# Patient Record
Sex: Female | Born: 1950 | Race: Black or African American | Hispanic: No | Marital: Married
Health system: Southern US, Community
[De-identification: ages and names within clinical notes are randomized; demographics above are authoritative.]

## PROBLEM LIST (undated history)

## (undated) HISTORY — PX: ABDOMINAL SURGERY: SHX537

---

## 2019-09-12 ENCOUNTER — Emergency Department (HOSPITAL_COMMUNITY)
Admission: EM | Admit: 2019-09-12 | Discharge: 2019-09-13 | Disposition: A | Discharge status: Home / Self Care | Payer: Medicare Other | Attending: Emergency Medicine | Admitting: Emergency Medicine

## 2019-09-12 ENCOUNTER — Other Ambulatory Visit: Payer: Self-pay

## 2019-09-12 ENCOUNTER — Emergency Department (HOSPITAL_COMMUNITY): Payer: Medicare Other

## 2019-09-12 ENCOUNTER — Encounter (HOSPITAL_COMMUNITY): Payer: Self-pay

## 2019-09-12 DIAGNOSIS — S8002XA Contusion of left knee, initial encounter: Secondary | ICD-10-CM | POA: Insufficient documentation

## 2019-09-12 DIAGNOSIS — Y9389 Activity, other specified: Secondary | ICD-10-CM | POA: Diagnosis not present

## 2019-09-12 DIAGNOSIS — Y999 Unspecified external cause status: Secondary | ICD-10-CM | POA: Insufficient documentation

## 2019-09-12 DIAGNOSIS — W010XXA Fall on same level from slipping, tripping and stumbling without subsequent striking against object, initial encounter: Secondary | ICD-10-CM | POA: Diagnosis not present

## 2019-09-12 DIAGNOSIS — Y92009 Unspecified place in unspecified non-institutional (private) residence as the place of occurrence of the external cause: Secondary | ICD-10-CM | POA: Diagnosis not present

## 2019-09-12 DIAGNOSIS — W19XXXA Unspecified fall, initial encounter: Secondary | ICD-10-CM

## 2019-09-12 DIAGNOSIS — S8992XA Unspecified injury of left lower leg, initial encounter: Secondary | ICD-10-CM | POA: Diagnosis present

## 2019-09-12 MED ORDER — ACETAMINOPHEN 325 MG PO TABS
650.0000 mg | ORAL_TABLET | Freq: Once | ORAL | Status: AC
  Administered 2019-09-12: 650 mg via ORAL
  Filled 2019-09-12: qty 2

## 2019-09-12 NOTE — ED Provider Notes (Signed)
[facility]-EMERGENCY DEPT Provider Note   CSN: [id number] Arrival date & time: 09/12/19  2215     History Chief Complaint  Patient presents with  . Knee Pain    Angel Morrison is a 69 y.o. female.  Patient to ED for evaluation of left knee pain after she tripped and fell in her living room onto carpeted surface. No other injury. She did not hit her head. No abdominal or chest pain. No wound or bleeding. She is not on blood thinners. She does not walk with a cane or walker regularly.  The history is provided by the patient. No language interpreter was used.  Knee Pain      History reviewed. No pertinent past medical history.  There are no problems to display for this patient.  OB History   No obstetric history on file.     History reviewed. No pertinent family history.  Social History   Tobacco Use  . Smoking status: Not on file  Substance Use Topics  . Alcohol use: Not on file  . Drug use: Not on file    Home Medications Prior to Admission medications   Not on File    Allergies    Patient has no known allergies.  Review of Systems   Review of Systems  Constitutional: Negative for diaphoresis.  Cardiovascular: Negative.  Negative for chest pain.  Gastrointestinal: Negative.  Negative for abdominal pain.  Musculoskeletal:       See HPI.  Skin: Positive for color change. Negative for wound.  Neurological: Negative.     Physical Exam Updated Vital Signs BP (!) 157/97 (BP Location: Left Arm)   Pulse 74   Temp (!) 97.5 F (36.4 C) (Oral)   Resp 16   SpO2 100%   Physical Exam Vitals and nursing note reviewed.  Constitutional:      General: She is not in acute distress.    Appearance: She is well-developed. She is obese.  Cardiovascular:     Pulses: Normal pulses.  Pulmonary:     Effort: Pulmonary effort is normal.  Chest:     Chest wall: No tenderness.  Abdominal:     Tenderness: There is no abdominal tenderness.    Musculoskeletal:     Cervical back: Normal range of motion.     Comments: Left knee has mild swelling to inferior anteromedial surface with associated ecchymosis. No wound. Mildly tender. No bony deformity. Joint stable. No calf or thigh tenderness. FROM all joints of the left LE.   Skin:    General: Skin is warm and dry.  Neurological:     Mental Status: She is alert and oriented to person, place, and time.     ED Results / Procedures / Treatments   Labs (all labs ordered are listed, but only abnormal results are displayed) Labs Reviewed - No data to display  EKG None  Radiology DG Knee Complete 4 Views Left  Result Date: 09/12/2019 CLINICAL DATA:  Tripped and fell, left knee pain, bruising EXAM: LEFT KNEE - COMPLETE 4+ VIEW COMPARISON:  None. FINDINGS: Frontal, bilateral oblique, lateral views of the left knee are obtained. 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 Prominent enthesopathic changes of the patella are also noted. No fracture, subluxation, or dislocation. Mild infrapatellar soft tissue edema. IMPRESSION: 1. Infrapatellar soft tissue edema. 2. No acute fracture. 3. Mild osteoarthritis. Electronically Signed   By: Sharlet Salina M.D.   On: 09/12/2019 23:06    Procedures  Procedures (including critical care time)  Medications Ordered in ED Medications - No data to display  ED Course  I have reviewed the triage vital signs and the nursing notes.  Pertinent labs & imaging results that were available during my care of the patient were reviewed by me and considered in my medical decision making (see chart for details).    MDM Rules/Calculators/A&P                          Patient to ED for evaluation of left knee injury after mechanical fall.   Imaging is negative for fracture. She does not take blood thinners. There is mild bruising as could be expected but no evidence of joint injury. She ambulates and is fully weight  bearing on the left leg.   Recommend tylenol and/or ibuprofen, cool compresses, elevation. PCP follow up as needed.  Final Clinical Impression(s) / ED Diagnoses Final diagnoses:  None   1. Mechanical fall 2. Left knee contusion  Rx / DC Orders ED Discharge Orders    None       Elpidio Anis, PA-C 09/12/19 2343    Shon Baton, MD 09/13/19 [phone]

## 2019-09-12 NOTE — ED Triage Notes (Signed)
Pt reports that she tripped in her den and landed on her knee on carpet. No head injury or LOC. Ambulatory.

## 2019-09-12 NOTE — Discharge Instructions (Addendum)
Your x-rays are negative for any broken bones of the knee. Recommend cool compresses, rest the knee and elevate to reduce swelling. Follow up with your doctor if pain worsens or you have any new or worsening symptoms.

## 2021-05-18 ENCOUNTER — Encounter (HOSPITAL_COMMUNITY): Payer: Self-pay

## 2021-05-18 ENCOUNTER — Emergency Department (HOSPITAL_COMMUNITY): Payer: Medicare Other

## 2021-05-18 ENCOUNTER — Emergency Department (HOSPITAL_COMMUNITY)
Admission: EM | Admit: 2021-05-18 | Discharge: 2021-05-18 | Disposition: A | Discharge status: Home / Self Care | Payer: Medicare Other | Attending: Emergency Medicine | Admitting: Emergency Medicine

## 2021-05-18 DIAGNOSIS — R1013 Epigastric pain: Secondary | ICD-10-CM | POA: Diagnosis present

## 2021-05-18 DIAGNOSIS — R197 Diarrhea, unspecified: Secondary | ICD-10-CM | POA: Diagnosis not present

## 2021-05-18 LAB — COMPREHENSIVE METABOLIC PANEL
ALT: 19 U/L (ref 0–44)
AST: 23 U/L (ref 15–41)
Albumin: 4.4 g/dL (ref 3.5–5.0)
Alkaline Phosphatase: 50 U/L (ref 38–126)
Anion gap: 10 (ref 5–15)
BUN: 23 mg/dL (ref 8–23)
CO2: 24 mmol/L (ref 22–32)
Calcium: 9.6 mg/dL (ref 8.9–10.3)
Chloride: 103 mmol/L (ref 98–111)
Creatinine, Ser: 0.82 mg/dL (ref 0.44–1.00)
GFR, Estimated: >60 mL/min (ref >60)
Glucose, Bld: 139 mg/dL — ABNORMAL HIGH (ref 70–99)
Potassium: 3.8 mmol/L (ref 3.5–5.1)
Sodium: 137 mmol/L (ref 135–145)
Total Bilirubin: 0.6 mg/dL (ref 0.3–1.2)
Total Protein: 8.1 g/dL (ref 6.5–8.1)

## 2021-05-18 LAB — CBC
HCT: 40.9 % (ref 36.0–46.0)
Hemoglobin: 13.1 g/dL (ref 12.0–15.0)
MCH: 29.2 pg (ref 26.0–34.0)
MCHC: 32 g/dL (ref 30.0–36.0)
MCV: 91.1 fL (ref 80.0–100.0)
Platelets: 303 10*3/uL (ref 150–400)
RBC: 4.49 MIL/uL (ref 3.87–5.11)
RDW: 14.4 % (ref 11.5–15.5)
WBC: 8.5 10*3/uL (ref 4.0–10.5)
nRBC: 0 % (ref 0.0–0.2)

## 2021-05-18 LAB — LIPASE, BLOOD: Lipase: 36 U/L (ref 11–51)

## 2021-05-18 MED ORDER — FENTANYL CITRATE PF 50 MCG/ML IJ SOSY
50.0000 ug | PREFILLED_SYRINGE | Freq: Once | INTRAMUSCULAR | Status: AC
  Administered 2021-05-18: 50 ug via INTRAVENOUS
  Filled 2021-05-18: qty 1

## 2021-05-18 MED ORDER — ONDANSETRON 8 MG PO TBDP: 8.0000 mg | ORAL_TABLET | Freq: Three times a day (TID) | ORAL | 0 refills | Status: AC | PRN

## 2021-05-18 MED ORDER — DICYCLOMINE HCL 20 MG PO TABS: 10.0000 mg | ORAL_TABLET | Freq: Two times a day (BID) | ORAL | 0 refills | Status: AC | PRN

## 2021-05-18 MED ORDER — ONDANSETRON HCL 4 MG/2ML IJ SOLN
4.0000 mg | Freq: Once | INTRAMUSCULAR | Status: AC
  Administered 2021-05-18: 4 mg via INTRAVENOUS
  Filled 2021-05-18: qty 2

## 2021-05-18 MED ORDER — IOHEXOL 350 MG/ML SOLN
80.0000 mL | Freq: Once | INTRAVENOUS | Status: AC | PRN
  Administered 2021-05-18: 80 mL via INTRAVENOUS

## 2021-05-18 MED ORDER — IOHEXOL 300 MG/ML  SOLN: 100.0000 mL | Freq: Once | INTRAMUSCULAR | Status: DC | PRN

## 2021-05-18 MED ORDER — SODIUM CHLORIDE (PF) 0.9 % IJ SOLN
INTRAMUSCULAR | Status: AC
Start: 2021-05-18 — End: 2021-05-18
  Filled 2021-05-18: qty 50

## 2021-05-18 MED ORDER — LACTATED RINGERS IV BOLUS
1000.0000 mL | Freq: Once | INTRAVENOUS | Status: AC
  Administered 2021-05-18: 1000 mL via INTRAVENOUS

## 2021-05-18 NOTE — Discharge Instructions (Addendum)
If diarrhea does not resolve in the next 5 days please reports your PCP to provide a stool sample ? ?SEEK IMMEDIATE MEDICAL ATTENTION IF: ?The pain does not go away or becomes severe, particularly over the next 8-12 hours.  ?A temperature above 100.20F develops.  ?Repeated vomiting occurs (multiple episodes).  ?The pain becomes localized to portions of the abdomen.  In an adult, the left lower portion of the abdomen could be colitis or diverticulitis.  ?Blood is being passed in stools or vomit (bright red or black tarry stools).  ?Return also if you develop chest pain, difficulty breathing, dizziness or fainting, or become confused, poorly responsive, or inconsolable. ? ?

## 2021-05-18 NOTE — ED Provider Notes (Signed)
?  Provider Note ?MRN:  [id number]  ?Arrival date & time: 05/18/21    ?ED Course and Medical Decision Making  ?Assumed care from [facility] at shift change. ? ?See not from prior team for complete details, in brief:  ? ?71 year old [place] from out of town ?Sudden onset lower abdominal pain, cramping, diarrhea late last night ? ?Abdominal pain, diarrhea ? ?Clinical picture concerning for enteritis, food borne illness ? ?CT pending ? ? ?Plan per prior physician CT, p.o. challenge ? ?CT has resulted, images reviewed by myself, agree with radiology interpretation.  No acute intra-abdominal findings. ? ?Patient reports she is feeling much better.  Nausea has since resolved.  She is able to tolerate p.o.  Agreeable plan for discharge home.  Close outpatient follow-up ? ?The patient's overall condition has improved, the patient presents with abdominal pain without signs of peritonitis, or other life-threatening serious etiology. The patient understands that at this time there is no evidence for a more malignant underlying process, but the patient also understands that early in the process of an illness, an emergency department workup can be falsely reassuring. Detailed discussions were had with the patient regarding current findings, and need for close f/u with PCP or on call doctor. The patient appears stable for discharge and has been instructed to return immediately if the symptoms worsen in any way for re-evaluation. Patient verbalized understanding and is in agreement with current care plan.  All questions answered prior to discharge. ? ? ?Procedures ? ?Final Clinical Impressions(s) / ED Diagnoses  ? ?  ICD-10-CM   ?1. Epigastric pain  R10.13   ?  ?2. Diarrhea, unspecified type  R19.7   ?  ?  ?ED Discharge Orders   ? ?      Ordered  ?  ondansetron (ZOFRAN-ODT) 8 MG disintegrating tablet  Every 8 hours PRN       ? 05/18/21 0645  ?  dicyclomine (BENTYL) 20 MG tablet  2 times daily PRN       ? 05/18/21 7829  ? ?  ?  ? ?  ?   ? ? ?Discharge Instructions   ? ?  ? If diarrhea does not resolve in the next 5 days please reports your PCP to provide a stool sample ? ?SEEK IMMEDIATE MEDICAL ATTENTION IF: ?The pain does not go away or becomes severe, particularly over the next 8-12 hours.  ?A temperature above 100.27F develops.  ?Repeated vomiting occurs (multiple episodes).  ?The pain becomes localized to portions of the abdomen.  In an adult, the left lower portion of the abdomen could be colitis or diverticulitis.  ?Blood is being passed in stools or vomit (bright red or black tarry stools).  ?Return also if you develop chest pain, difficulty breathing, dizziness or fainting, or become confused, poorly responsive, or inconsolable. ? ? ? ? ? ?  ?Sloan Leiter, DO ?05/18/21 [phone] ? ?

## 2021-05-18 NOTE — ED Notes (Signed)
Full rainbow collected on this pt and sent to lab ?

## 2021-05-18 NOTE — ED Notes (Signed)
Pt placed on bedside commode.  

## 2021-05-18 NOTE — ED Triage Notes (Signed)
Pt came in with c/o abdominal pain and diarrhea that started approx four hours ago. She states it started on a car ride/ Pt states the pain is epigastric. Pt has hx of cholecystectomy. Pt endorses nausea and dry heaving ?

## 2021-05-18 NOTE — ED Provider Notes (Signed)
?[facility]-EMERGENCY DEPT ?Provider Note ? ? ?CSN: [id number] ?Arrival date & time: 05/18/21  7353 ? ?  ? ?History ? ?Chief Complaint  ?Patient presents with  ? Abdominal Pain  ? ? ?Angel Morrison is a 71 y.o. female. ? ?The history is provided by the patient and a relative.  ?Abdominal Pain ?Pain location:  Epigastric ?Pain quality: cramping   ?Pain severity:  Moderate ?Onset quality:  Gradual ?Timing:  Intermittent ?Progression:  Worsening ?Chronicity:  New ?Relieved by:  Nothing ?Worsened by:  Movement and palpation ?Associated symptoms: no fever   ?Patient reports she began having epigastric abdominal pain several hours ago.  She also reports associated nausea, vomiting and diarrhea.  It has been nonbloody.  She reports both episodes of diarrhea.  No known sick contacts.  She does not think it was food related ?She reports multiple abdominal surgeries previously ?  ?  ? ?Home Medications ?Prior to Admission medications   ?Medication Sig Start Date End Date Taking? Authorizing Provider  ?ondansetron (ZOFRAN-ODT) 8 MG disintegrating tablet Take 1 tablet (8 mg total) by mouth every 8 (eight) hours as needed. 05/18/21  Yes Zadie Rhine, MD  ?   ? ?Allergies    ?Patient has no known allergies.   ? ?Review of Systems   ?Review of Systems  ?Constitutional:  Negative for fever.  ?Gastrointestinal:  Positive for abdominal pain.  ? ?Physical Exam ?Updated Vital Signs ?BP (!) 166/84   Pulse 86   Temp 97.9 ?F (36.6 ?C) (Oral)   Resp 18   Ht 1.473 m (4\' 10" )   Wt 87.1 kg   SpO2 96%   BMI 40.13 kg/m?  ?Physical Exam ?CONSTITUTIONAL: Elderly, uncomfortable appearing ?HEAD: Normocephalic/atraumatic ?EYES: EOMI/PERRL, no icterus ?NECK: supple no meningeal signs ?SPINE/BACK:entire spine nontender ?CV: S1/S2 noted, no murmurs/rubs/gallops noted ?LUNGS: Lungs are clear to auscultation bilaterally, no apparent distress ?ABDOMEN: soft, moderate epigastric tenderness, no rebound or guarding, bowel sounds  noted throughout abdomen ?GU:no cva tenderness ?NEURO: Pt is awake/alert/appropriate, moves all extremitiesx4.  No facial droop.   ?EXTREMITIES: pulses normal/equal, full ROM ?SKIN: warm, color normal ?PSYCH: Anxious ? ?ED Results / Procedures / Treatments   ?Labs ?(all labs ordered are listed, but only abnormal results are displayed) ?Labs Reviewed  ?COMPREHENSIVE METABOLIC PANEL - Abnormal; Notable for the following components:  ?    Result Value  ? Glucose, Bld 139 (*)   ? All other components within normal limits  ?LIPASE, BLOOD  ?CBC  ? ? ?EKG ?EKG Interpretation ? ?Date/Time:  Monday May 18 2021 06:10:05 EDT ?Ventricular Rate:  84 ?PR Interval:  162 ?QRS Duration: 100 ?QT Interval:  379 ?QTC Calculation: 448 ?R Axis:   -26 ?Text Interpretation: Sinus rhythm Borderline left axis deviation RSR' in V1 or V2, right VCD or RVH Consider anterior infarct T wave inversion Confirmed by 01-24-1989 (Zadie Rhine) on 05/18/2021 6:19:21 AM ? ?Radiology ?No results found. ? ?Procedures ?Procedures  ? ? ?Medications Ordered in ED ?Medications  ?fentaNYL (SUBLIMAZE) injection 50 mcg (has no administration in time range)  ?ondansetron [facility]) injection 4 mg (4 mg Intravenous Given 05/18/21 0538)  ?fentaNYL (SUBLIMAZE) injection 50 mcg (50 mcg Intravenous Given 05/18/21 0536)  ?lactated ringers bolus 1,000 mL (1,000 mLs Intravenous New Bag/Given 05/18/21 0538)  ? ? ?ED Course/ Medical Decision Making/ A&P ?Clinical Course as of 05/18/21 0715  ?Mon May 18, 2021  ?0604 Glucose(!): 139 ?Mild hyperglycemia [DW]  ?0605 Discussed lab findings with patient.  She is feeling improved.  She has no focal tenderness at this time.  At this point no indication for imaging.  Suspect this will be self-limited and resolved the next 24 hours.  Discussed appropriate use of Imodium and will also prescribe Zofran [DW]  ?[phone] Discussed strict ER return precautions with patient and her daughter [DW]  ?[phone] Patient now reporting she is having abdominal  pain, will proceed with CT imaging [DW]  ?0708 Signed out to Dr Gwenette Greet at shift change to f/u on CT imaging [DW]  ?  ?Clinical Course User Index ?[DW] Zadie Rhine, MD  ? ?                        ?Medical Decision Making ?Amount and/or Complexity of Data Reviewed ?Labs: ordered. Decision-making details documented in ED Course. ?Radiology: ordered. ?ECG/medicine tests: ordered. ? ?Risk ?Prescription drug management. ? ? ?This patient presents to the ED for concern of abdominal pain, vomiting diarrhea, this involves an extensive number of treatment options, and is a complaint that carries with it a high risk of complications and morbidity.  The differential diagnosis includes but is not limited to gastritis, gastroenteritis, bowel obstruction, bowel perforation ? ?Comorbidities that complicate the patient evaluation: ?Patient?s presentation is complicated by their history of obesity ? ?Additional history obtained: ?Additional history obtained from family ?Records reviewed Care Everywhere/External Records ? ?Lab Tests: ?I Ordered, and personally interpreted labs.  The pertinent results include:  hyperglycemia ? ? ?Cardiac Monitoring: ?The patient was maintained on a cardiac monitor.  I personally viewed and interpreted the cardiac monitor which showed an underlying rhythm of:  sinus rhythm ? ?Medicines ordered and prescription drug management: ?I ordered medication including fentanyl and Zofran for pain and nausea ?Reevaluation of the patient after these medicines showed that the patient    improved ? ? ?Reevaluation: ?After the interventions noted above, I reevaluated the patient and found that they have :improved ? ?Complexity of problems addressed: ?Patient?s presentation is most consistent with  acute presentation with potential threat to life or bodily function ? ?  ? ? ? ? ? ? ? ? ?Final Clinical Impression(s) / ED Diagnoses ?Final diagnoses:  ?Epigastric pain  ?Diarrhea, unspecified type  ? ? ?Rx / DC  Orders ?ED Discharge Orders   ? ?      Ordered  ?  ondansetron (ZOFRAN-ODT) 8 MG disintegrating tablet  Every 8 hours PRN       ? 05/18/21 0645  ? ?  ?  ? ?  ? ? ?  ?Zadie Rhine, MD ?05/18/21 0715 ? ?

## 2023-12-02 IMAGING — CT CT ABD-PELV W/ CM
2 of 5 series · 16 of 46 positions shown, 18 images · IV contrast (OMNIPAQUE 350)
Comparison: None.

CLINICAL DATA: Epigastric pain with diarrhea Previous - Abdominal
hernia repairs and SBO^80mL OMNIPAQUE IOHEXOL 350 MG/ML
SOLNAbdominal pain, acute, nonlocalized

EXAM:
CT ABDOMEN AND PELVIS WITH CONTRAST
TECHNIQUE: Multidetector CT imaging of the abdomen and pelvis was performed
using the standard protocol following bolus administration of
intravenous contrast.

[Series 3: axial st · axial · 0.84mm/px · z∈[+1229,+1569]mm · 13 of 80 slices shown, 15 images]
[im 6/80  soft-tissue]
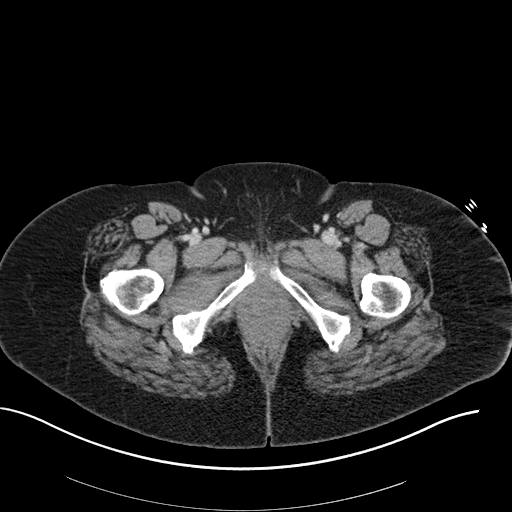
[im 6/80  bone]
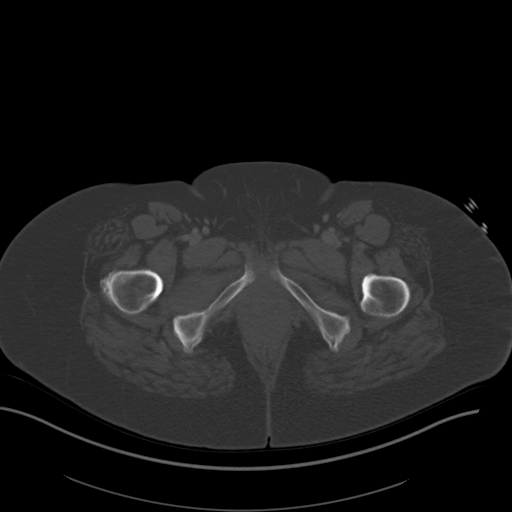
[im 12/80  soft-tissue]
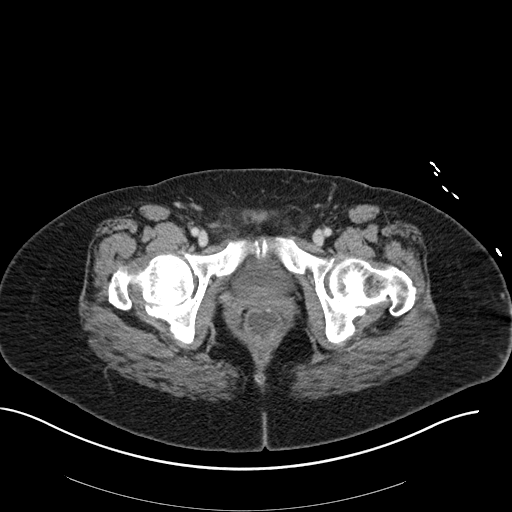
[im 17/80  soft-tissue]
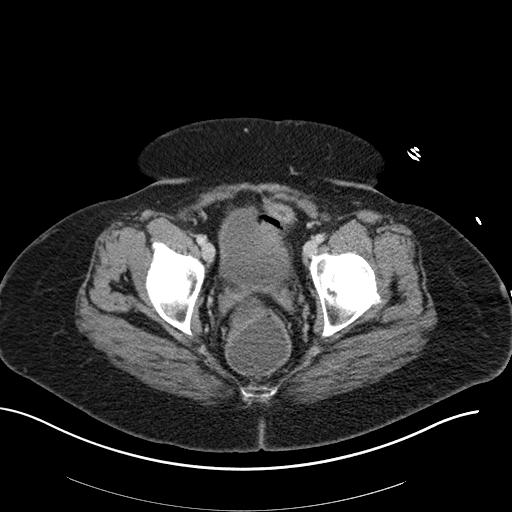
[im 23/80  soft-tissue]
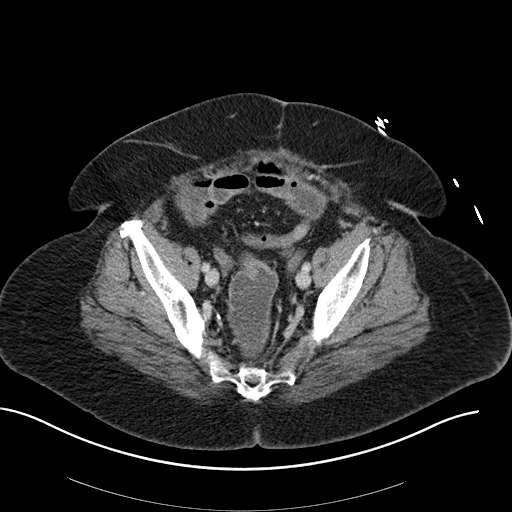
[im 29/80  soft-tissue]
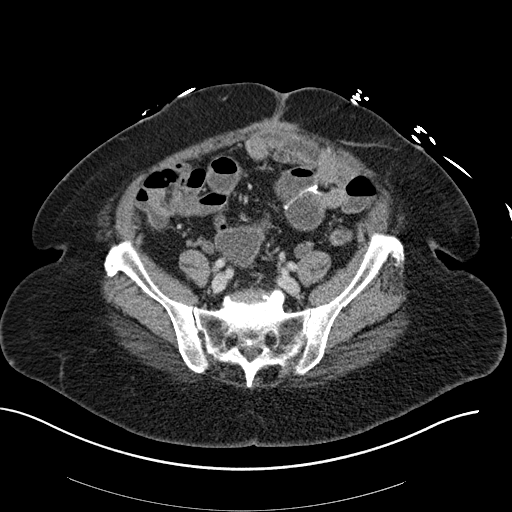
[im 34/80  soft-tissue]
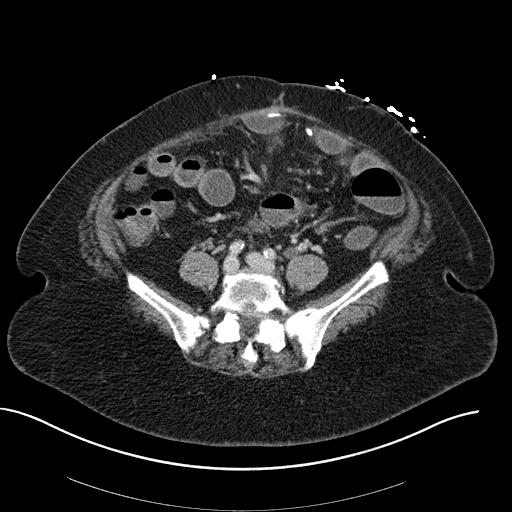
[im 40/80  soft-tissue]
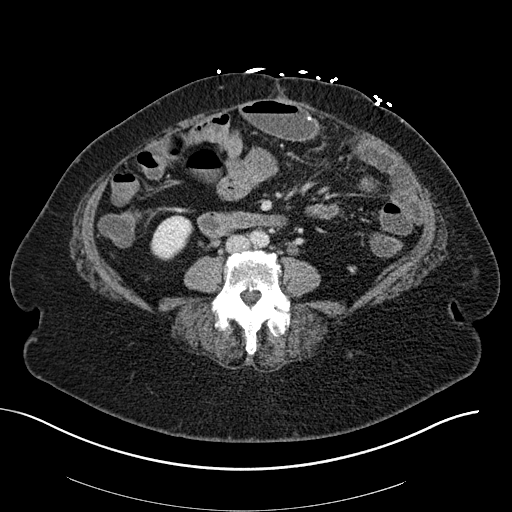
[im 46/80  soft-tissue]
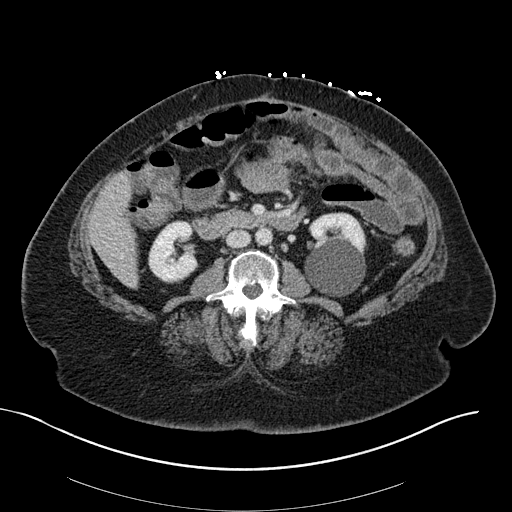
[im 51/80  soft-tissue]
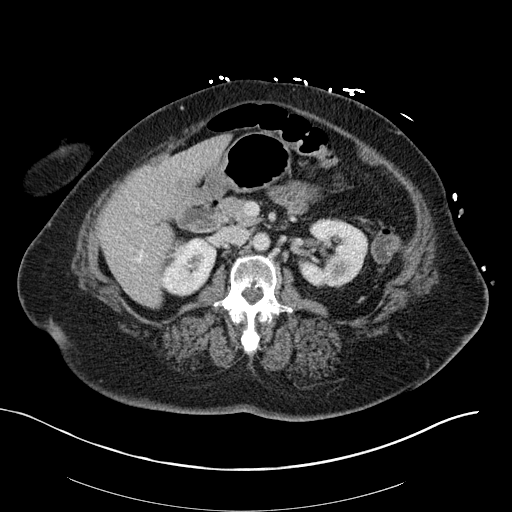
[im 51/80  bone]
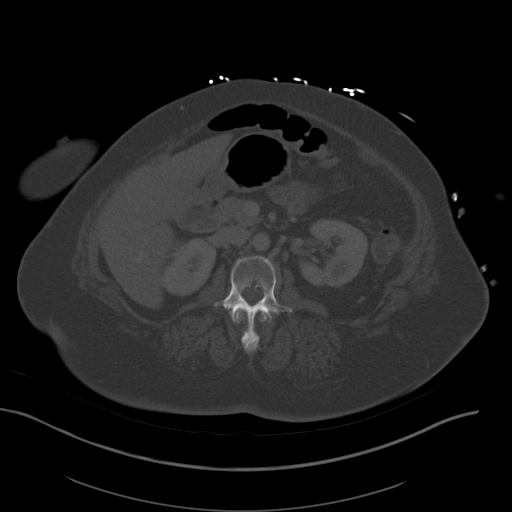
[im 57/80  soft-tissue]
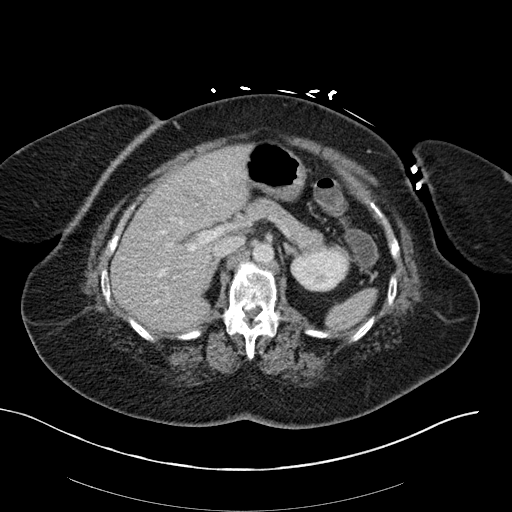
[im 63/80  soft-tissue]
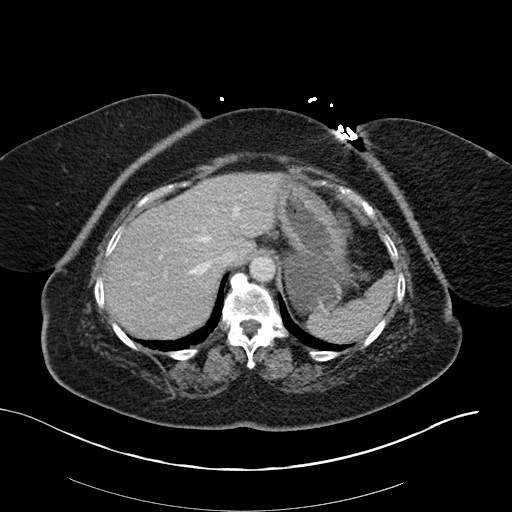
[im 68/80  soft-tissue]
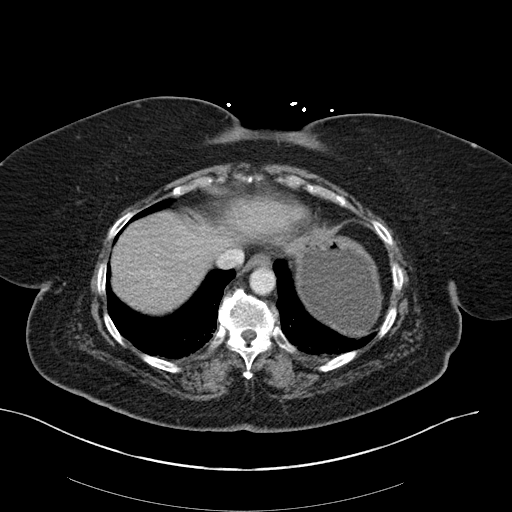
[im 74/80  soft-tissue]
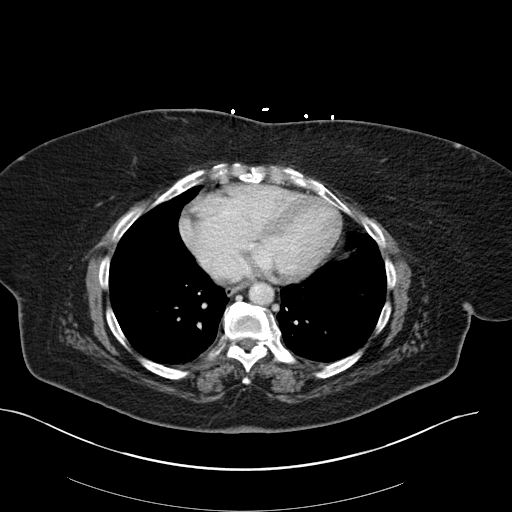

[Series 6: coronal st · coronal · 0.75mm/px · 3 of 151 slices shown]
[im 51/151  soft-tissue]
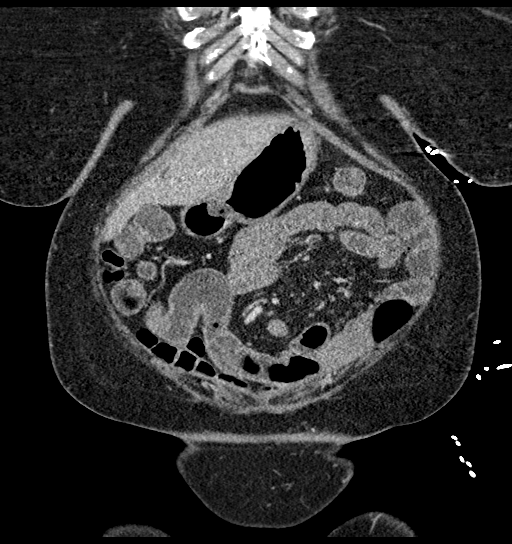
[im 67/151  soft-tissue]
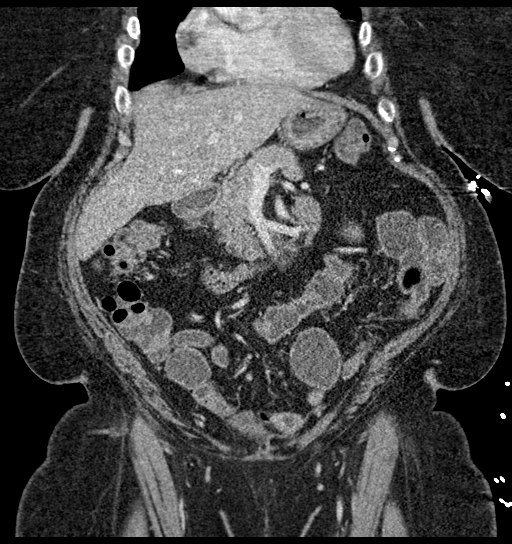
[im 84/151  soft-tissue]
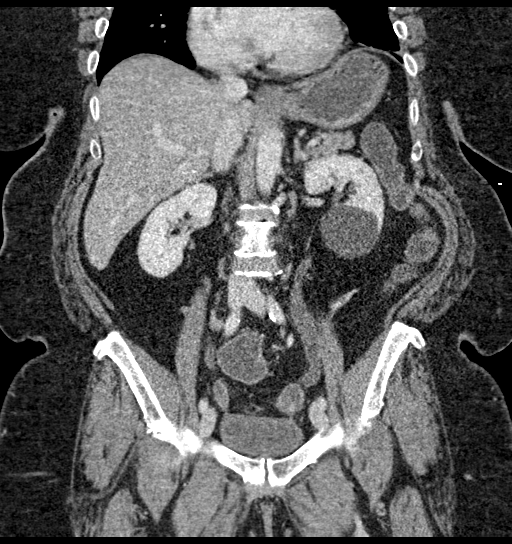

[16 of 46 positions shown; findings below may reference images not displayed]

RADIATION DOSE REDUCTION: This exam was performed according to the
departmental dose-optimization program which includes automated
exposure control, adjustment of the mA and/or kV according to
patient size and/or use of iterative reconstruction technique.

CONTRAST:  80mL OMNIPAQUE IOHEXOL 350 MG/ML SOLN
FINDINGS: Lower chest: Lung bases are clear.

Hepatobiliary: No focal hepatic lesion. Postcholecystectomy. No
biliary dilatation.

Pancreas: Pancreas is normal. No ductal dilatation. No pancreatic
inflammation.

Spleen: Normal spleen

Adrenals/urinary tract: Adrenal glands normal. Simple fluid
attenuation cyst of the RIGHT kidney. No follow-up recommended.
Ureters and bladder normal.

Stomach/Bowel: Stomach and duodenum normal. Prior small bowel
resection with 2 enteric enteric anastomosis noted. No evidence
bowel obstruction or inflammation. Appendix cecum normal. Colon
rectosigmoid colon normal. Fluid stool in the descending colon and
rectum.

Vascular/Lymphatic: Abdominal aorta is normal caliber. No periportal
or retroperitoneal adenopathy. No pelvic adenopathy.

Reproductive: Post hysterectomy.  Adnexa unremarkable

Other: No free fluid.

Musculoskeletal: No aggressive osseous lesion.
IMPRESSION: 1. Fluid density stool in the rectum.
2. Prior small bowel surgery anatomy. No evidence of small-bowel
obstruction or inflammation.
3. Post cholecystectomy.
